# Patient Record
Sex: Male | Born: 1975 | Hispanic: Yes | Marital: Single | Smoking: Never smoker
Health system: Southern US, Community
[De-identification: ages and names within clinical notes are randomized; demographics above are authoritative.]

## PROBLEM LIST (undated history)

## (undated) DIAGNOSIS — M549 Dorsalgia, unspecified: Secondary | ICD-10-CM

## (undated) DIAGNOSIS — J302 Other seasonal allergic rhinitis: Secondary | ICD-10-CM

---

## 2010-05-11 ENCOUNTER — Emergency Department (HOSPITAL_COMMUNITY): Admission: EM | Admit: 2010-05-11 | Discharge: 2010-05-11 | Payer: Self-pay | Admitting: Family Medicine

## 2010-05-20 ENCOUNTER — Emergency Department (HOSPITAL_COMMUNITY): Admission: EM | Admit: 2010-05-20 | Discharge: 2010-05-20 | Payer: Self-pay | Admitting: Family Medicine

## 2010-07-01 ENCOUNTER — Ambulatory Visit (HOSPITAL_COMMUNITY)
Admission: RE | Admit: 2010-07-01 | Discharge: 2010-07-01 | Payer: Self-pay | Source: Home / Self Care | Admitting: Family Medicine

## 2010-09-23 ENCOUNTER — Encounter
Admission: RE | Admit: 2010-09-23 | Discharge: 2010-09-29 | Payer: Self-pay | Source: Home / Self Care | Attending: Family Medicine | Admitting: Family Medicine

## 2010-09-25 ENCOUNTER — Ambulatory Visit (HOSPITAL_COMMUNITY)
Admission: RE | Admit: 2010-09-25 | Discharge: 2010-09-25 | Payer: Self-pay | Source: Home / Self Care | Attending: Family Medicine | Admitting: Family Medicine

## 2010-09-30 ENCOUNTER — Ambulatory Visit: Payer: Self-pay | Attending: Family Medicine | Admitting: Physical Therapy

## 2010-09-30 ENCOUNTER — Encounter: Payer: Self-pay | Admitting: Rehabilitative and Restorative Service Providers"

## 2010-09-30 DIAGNOSIS — IMO0001 Reserved for inherently not codable concepts without codable children: Secondary | ICD-10-CM | POA: Insufficient documentation

## 2010-09-30 DIAGNOSIS — M545 Low back pain, unspecified: Secondary | ICD-10-CM | POA: Insufficient documentation

## 2010-09-30 DIAGNOSIS — M25559 Pain in unspecified hip: Secondary | ICD-10-CM | POA: Insufficient documentation

## 2010-09-30 DIAGNOSIS — M25569 Pain in unspecified knee: Secondary | ICD-10-CM | POA: Insufficient documentation

## 2010-10-07 ENCOUNTER — Ambulatory Visit: Payer: Self-pay | Admitting: Rehabilitative and Restorative Service Providers"

## 2010-10-14 ENCOUNTER — Ambulatory Visit: Payer: Self-pay | Admitting: Rehabilitative and Restorative Service Providers"

## 2010-10-15 ENCOUNTER — Ambulatory Visit: Payer: Self-pay | Admitting: Physical Therapy

## 2010-10-16 ENCOUNTER — Encounter: Payer: Self-pay | Admitting: Physical Therapy

## 2010-10-19 ENCOUNTER — Encounter: Payer: Self-pay | Admitting: Rehabilitative and Restorative Service Providers"

## 2010-10-21 ENCOUNTER — Encounter: Payer: Self-pay | Admitting: Rehabilitative and Restorative Service Providers"

## 2010-11-12 LAB — DIFFERENTIAL
Basophils Absolute: 0 10*3/uL (ref 0.0–0.1)
Basophils Relative: 0 % (ref 0–1)
Eosinophils Absolute: 0.9 10*3/uL — ABNORMAL HIGH (ref 0.0–0.7)
Eosinophils Relative: 10 % — ABNORMAL HIGH (ref 0–5)
Lymphocytes Relative: 32 % (ref 12–46)
Monocytes Absolute: 0.7 10*3/uL (ref 0.1–1.0)

## 2010-11-12 LAB — POCT I-STAT, CHEM 8
BUN: 14 mg/dL (ref 6–23)
Calcium, Ion: 1.17 mmol/L (ref 1.12–1.32)
HCT: 50 % (ref 39.0–52.0)
Hemoglobin: 17 g/dL (ref 13.0–17.0)
Sodium: 141 mEq/L (ref 135–145)
TCO2: 33 mmol/L (ref 0–100)

## 2010-11-12 LAB — CBC
HCT: 45.5 % (ref 39.0–52.0)
MCHC: 34.9 g/dL (ref 30.0–36.0)
MCV: 89.7 fL (ref 78.0–100.0)
Platelets: 174 10*3/uL (ref 150–400)
RDW: 13.1 % (ref 11.5–15.5)
WBC: 8.7 10*3/uL (ref 4.0–10.5)

## 2010-11-12 LAB — GLUCOSE, CAPILLARY: Glucose-Capillary: 113 mg/dL — ABNORMAL HIGH (ref 70–99)

## 2010-12-25 ENCOUNTER — Inpatient Hospital Stay (INDEPENDENT_AMBULATORY_CARE_PROVIDER_SITE_OTHER)
Admission: RE | Admit: 2010-12-25 | Discharge: 2010-12-25 | Disposition: A | Discharge status: Home / Self Care | Payer: Self-pay | Source: Ambulatory Visit | Attending: Family Medicine | Admitting: Family Medicine

## 2010-12-25 DIAGNOSIS — J309 Allergic rhinitis, unspecified: Secondary | ICD-10-CM

## 2011-01-13 ENCOUNTER — Inpatient Hospital Stay (INDEPENDENT_AMBULATORY_CARE_PROVIDER_SITE_OTHER)
Admission: RE | Admit: 2011-01-13 | Discharge: 2011-01-13 | Disposition: A | Discharge status: Home / Self Care | Payer: Self-pay | Source: Ambulatory Visit | Attending: Family Medicine | Admitting: Family Medicine

## 2011-01-13 DIAGNOSIS — J019 Acute sinusitis, unspecified: Secondary | ICD-10-CM

## 2011-03-24 ENCOUNTER — Inpatient Hospital Stay (INDEPENDENT_AMBULATORY_CARE_PROVIDER_SITE_OTHER)
Admission: RE | Admit: 2011-03-24 | Discharge: 2011-03-24 | Disposition: A | Discharge status: Home / Self Care | Payer: Self-pay | Source: Ambulatory Visit | Attending: Emergency Medicine | Admitting: Emergency Medicine

## 2011-03-24 DIAGNOSIS — J309 Allergic rhinitis, unspecified: Secondary | ICD-10-CM

## 2011-03-24 DIAGNOSIS — J019 Acute sinusitis, unspecified: Secondary | ICD-10-CM

## 2011-04-08 ENCOUNTER — Inpatient Hospital Stay (INDEPENDENT_AMBULATORY_CARE_PROVIDER_SITE_OTHER)
Admission: RE | Admit: 2011-04-08 | Discharge: 2011-04-08 | Disposition: A | Discharge status: Home / Self Care | Payer: Self-pay | Source: Ambulatory Visit | Attending: Emergency Medicine | Admitting: Emergency Medicine

## 2011-04-08 DIAGNOSIS — R51 Headache: Secondary | ICD-10-CM

## 2011-10-06 IMAGING — CT CT HEAD W/O CM
1 series · 16 of 30 positions shown, 20 images · non-contrast
Comparison: None.

CLINICAL DATA: Headache.

CT HEAD WITHOUT CONTRAST
TECHNIQUE: Contiguous axial images were obtained from the base of
the skull through the vertex without contrast.

[Series 2: head routine 4.8 h37s · axial · 0.44mm/px · z∈[-136,-3]mm · 16 of 30 slices shown, 20 images]
[im 2/30  brain]
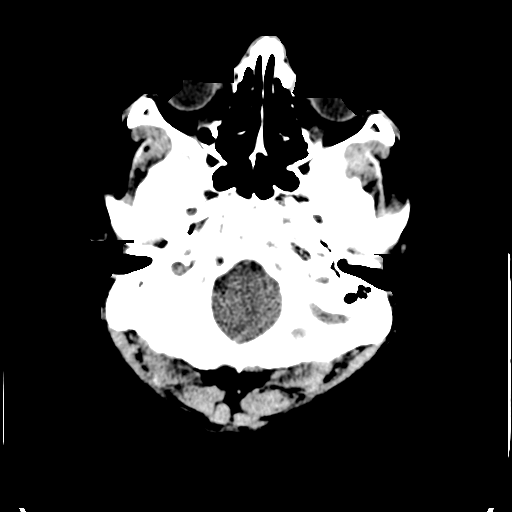
[im 2/30  bone]
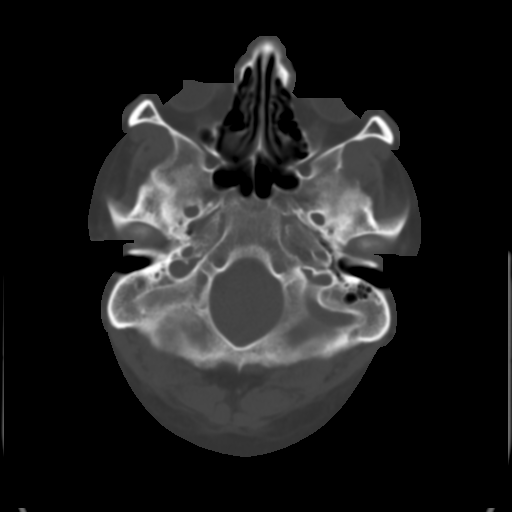
[im 4/30  brain]
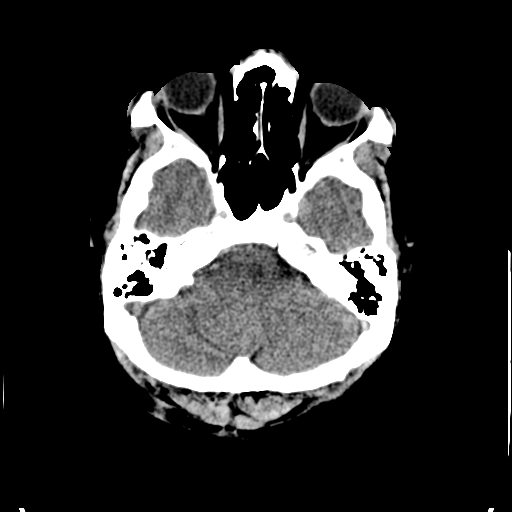
[im 6/30  brain]
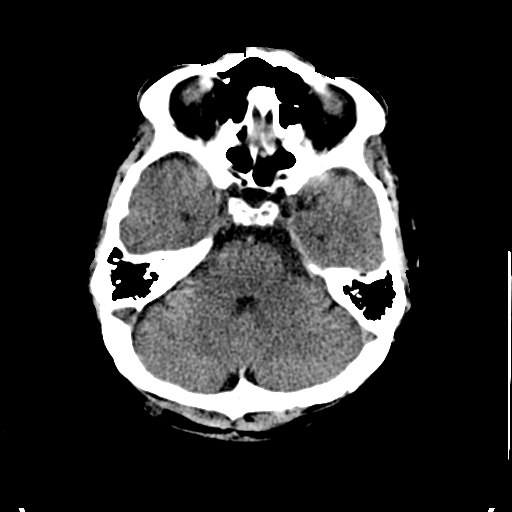
[im 8/30  brain]
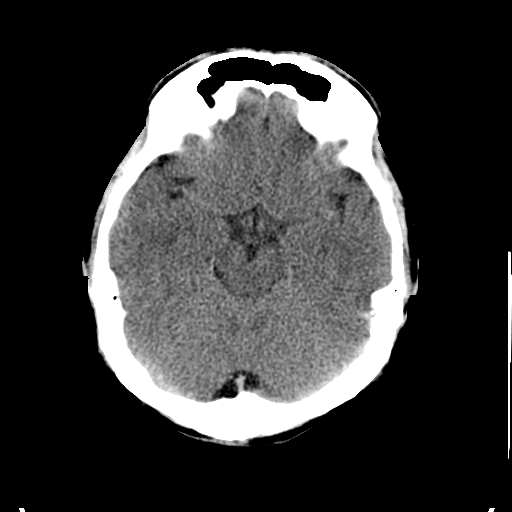
[im 9/30  brain]
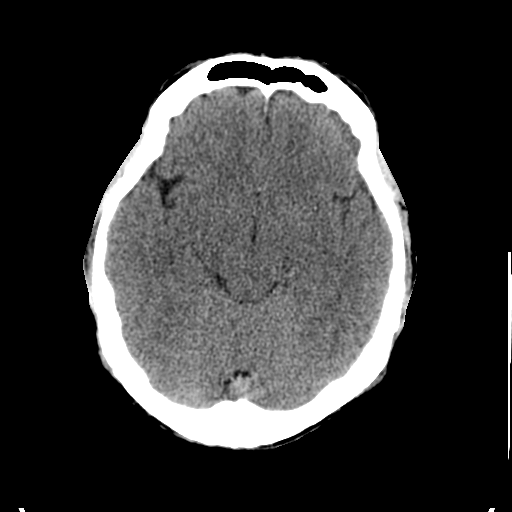
[im 9/30  bone]
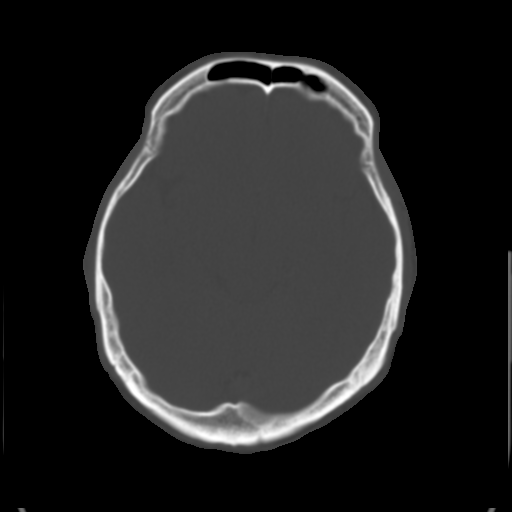
[im 11/30  brain]
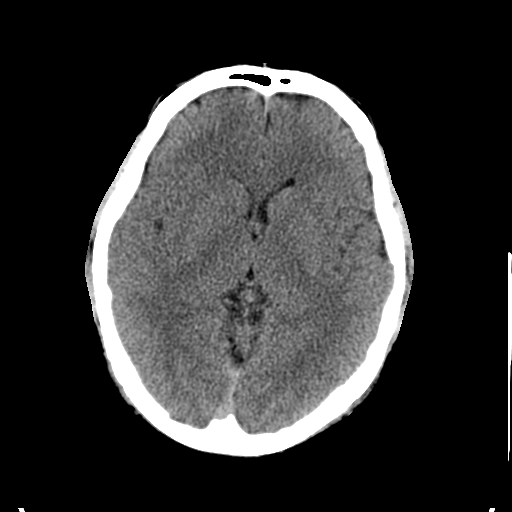
[im 13/30  brain]
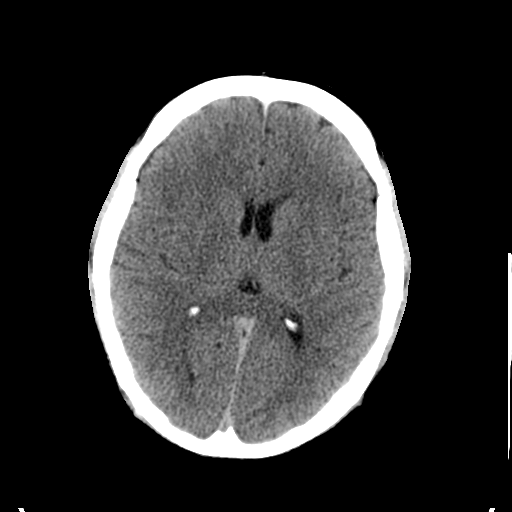
[im 15/30  brain]
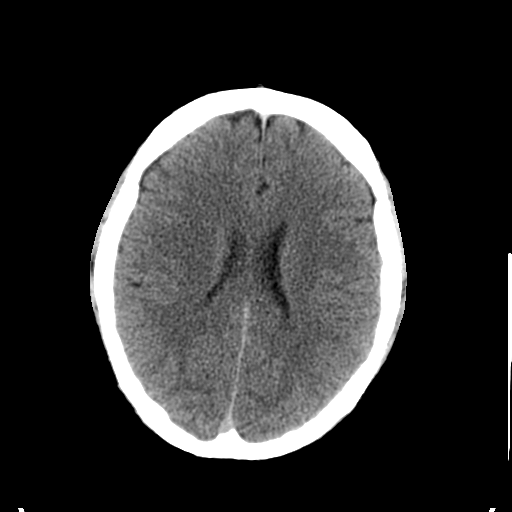
[im 16/30  brain]
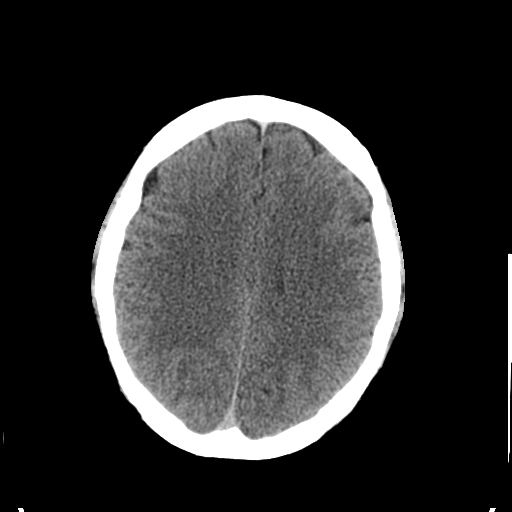
[im 16/30  bone]
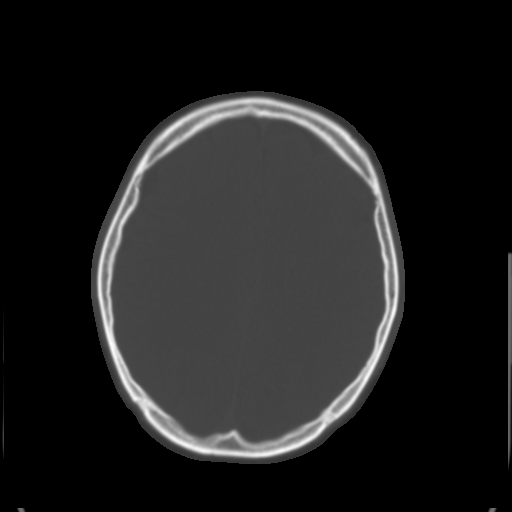
[im 18/30  brain]
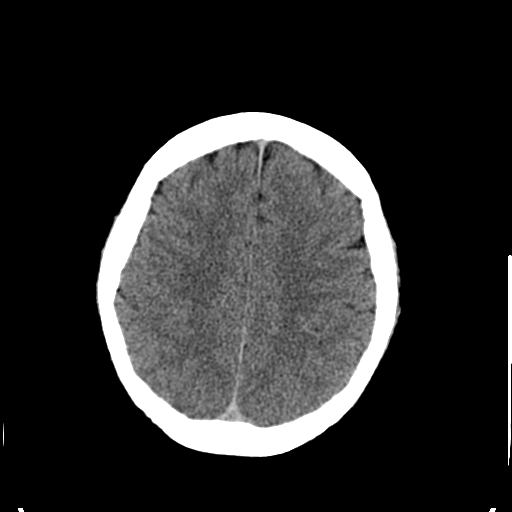
[im 20/30  brain]
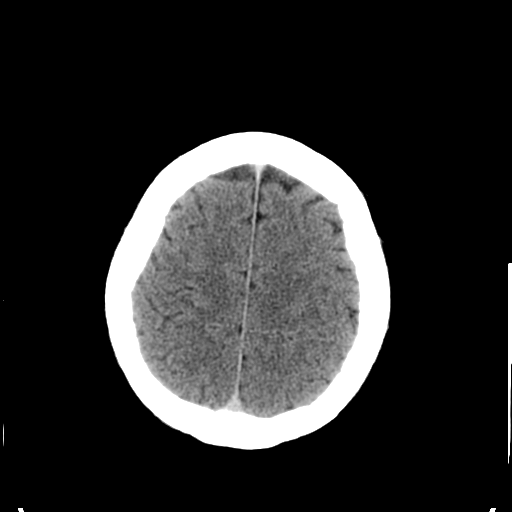
[im 22/30  brain]
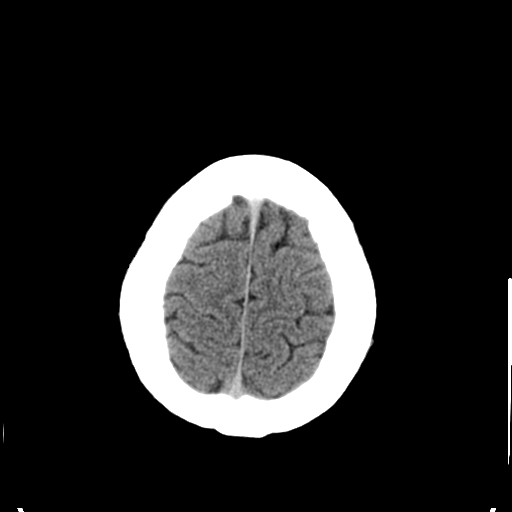
[im 23/30  brain]
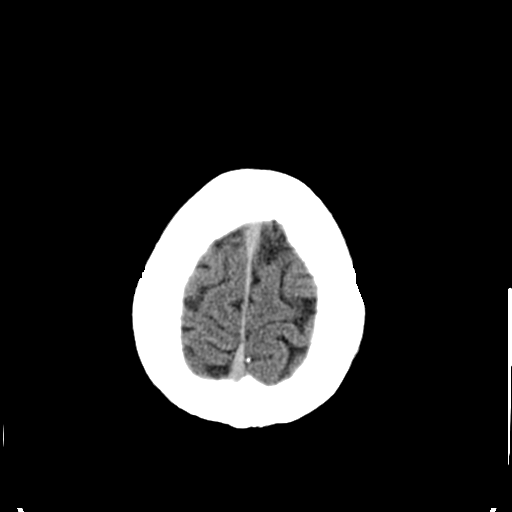
[im 23/30  bone]
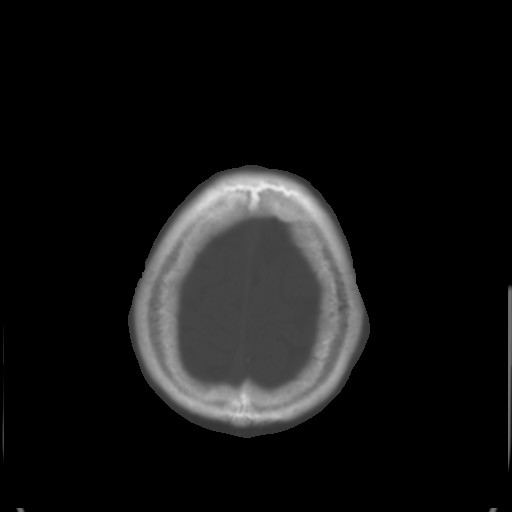
[im 25/30  brain]
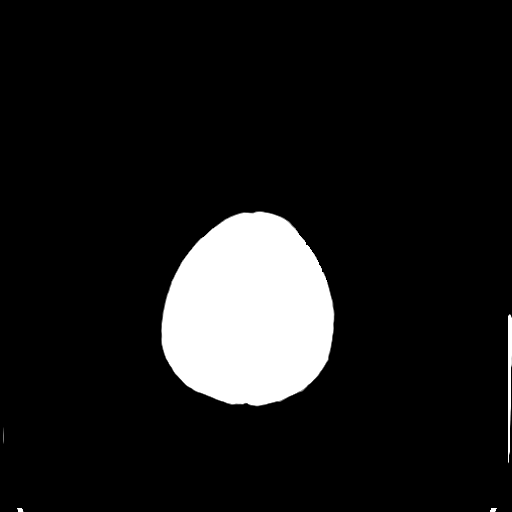
[im 27/30  brain]
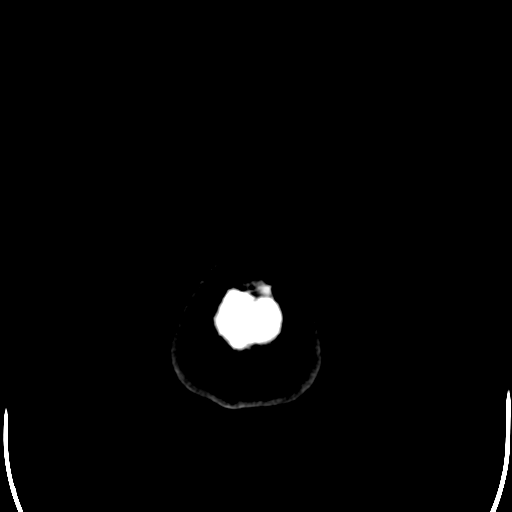
[im 29/30  brain]
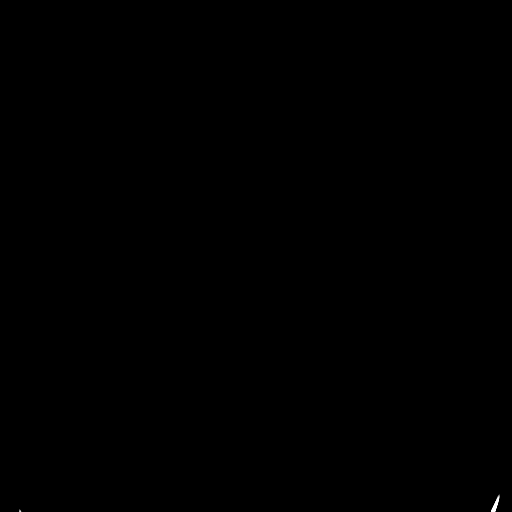

[16 of 30 positions shown; findings below may reference images not displayed]

FINDINGS: There is no acute intracranial infarction, hemorrhage, or
mass lesion.  The brain parenchyma is normal except for slight
asymmetry of the lateral ventricles which is felt to be congenital.

The osseous structures are normal.
IMPRESSION: No significant abnormalities.

## 2011-11-17 IMAGING — CR DG LUMBAR SPINE COMPLETE 4+V
5 series · 5 of 5 positions shown · non-contrast
Comparison: None.

CLINICAL DATA: 33-year-old male with low back pain radiating to the
right leg.  No known injury.

LUMBAR SPINE - COMPLETE 4+ VIEW

[t l-spine a.p.]
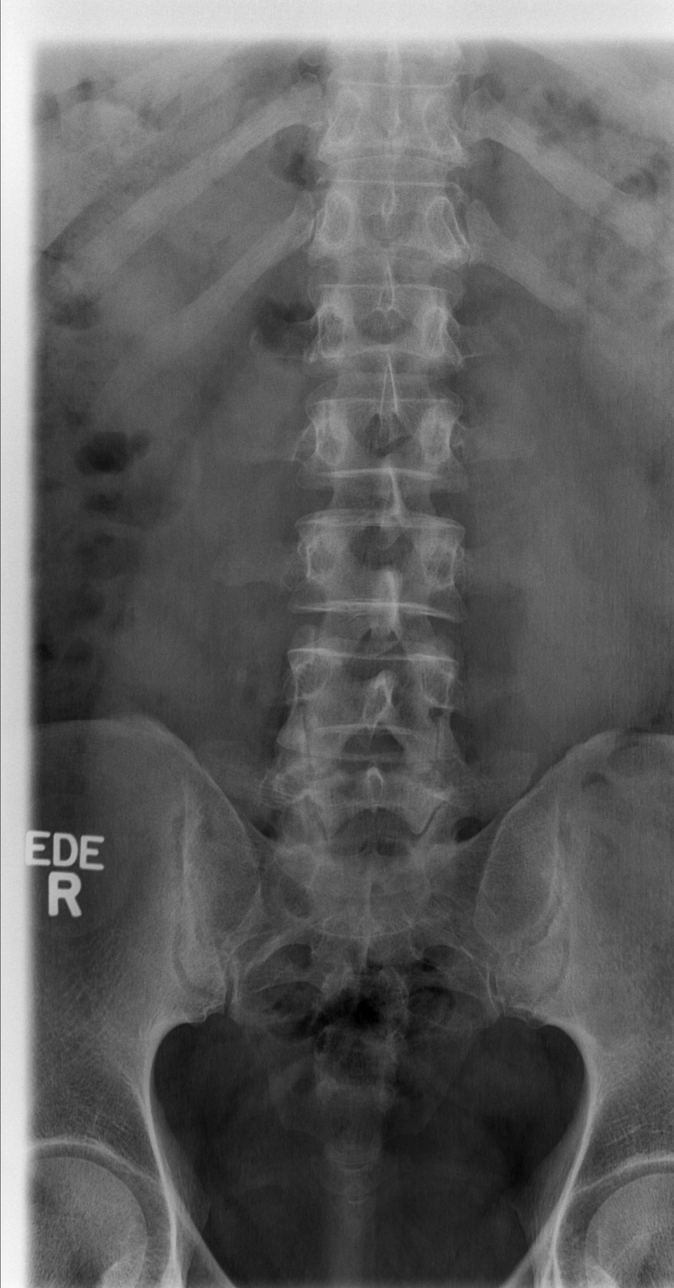

[t l-spine oblique exposure (1 of 2)]
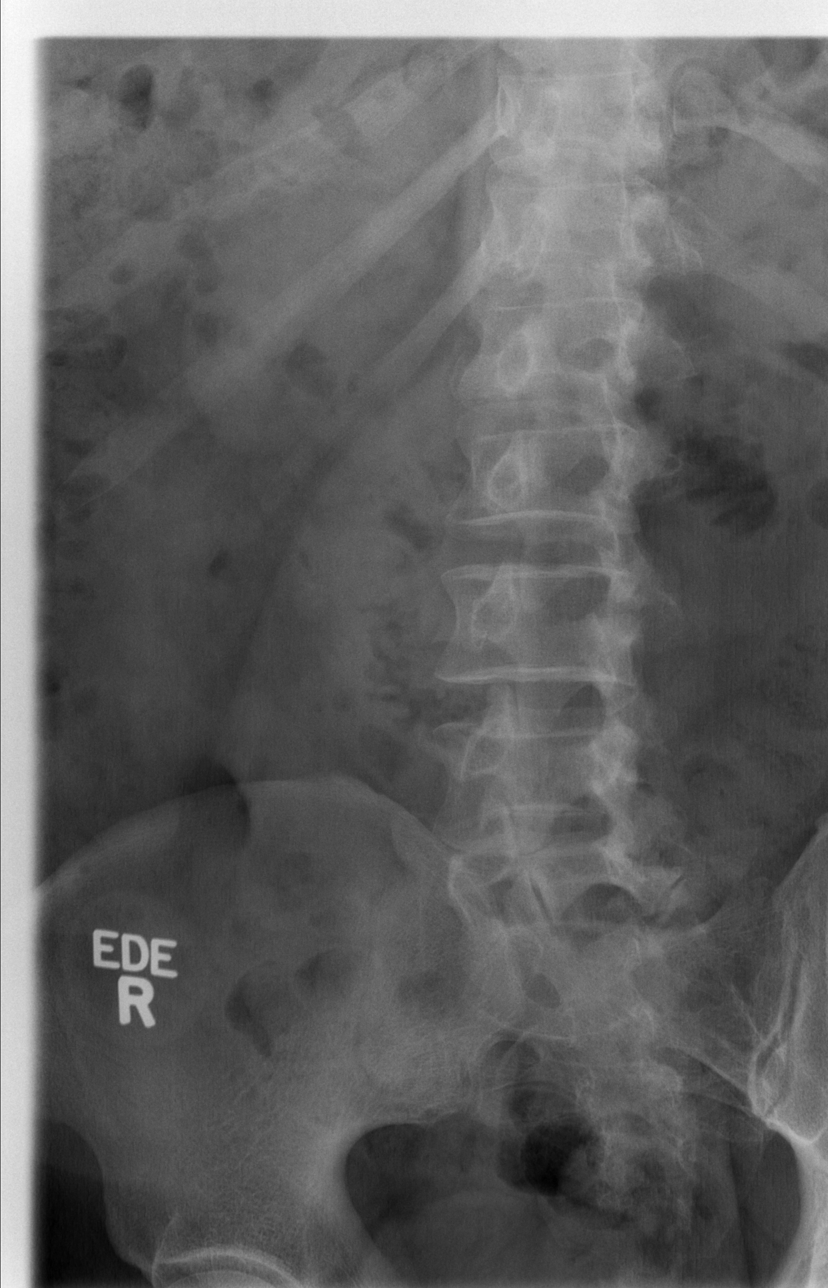

[t l-spine oblique exposure (2 of 2)]
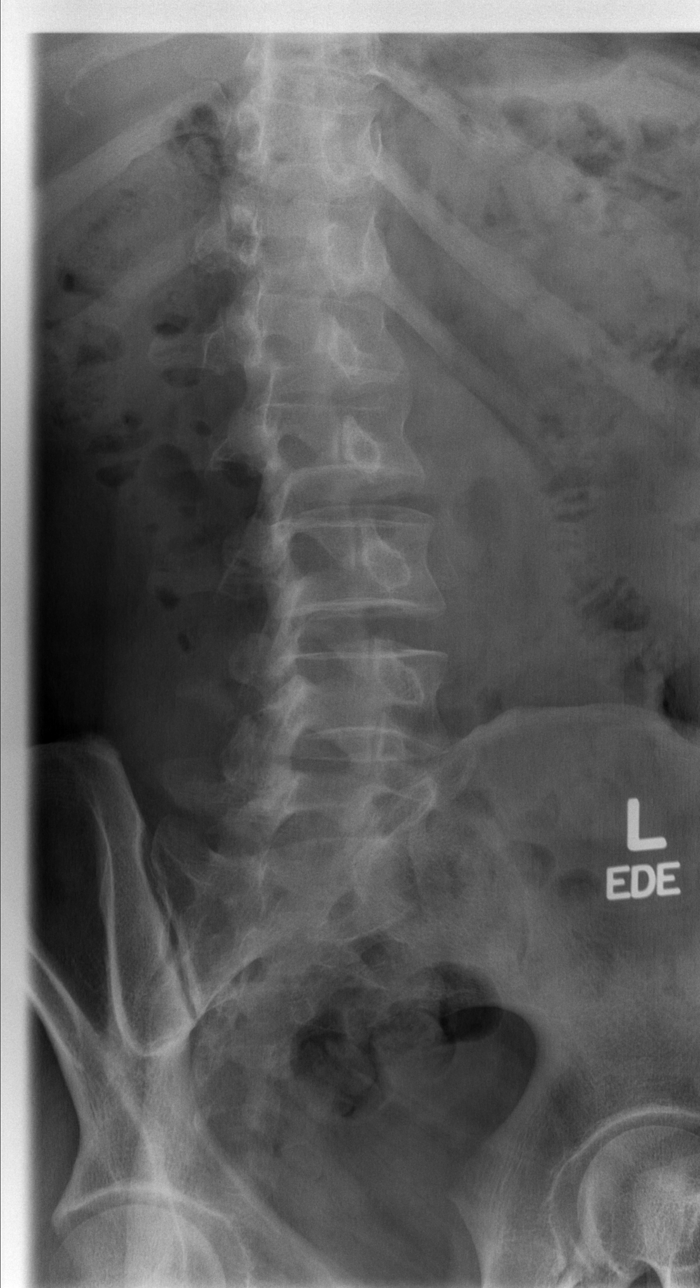

[t l-spine lat]
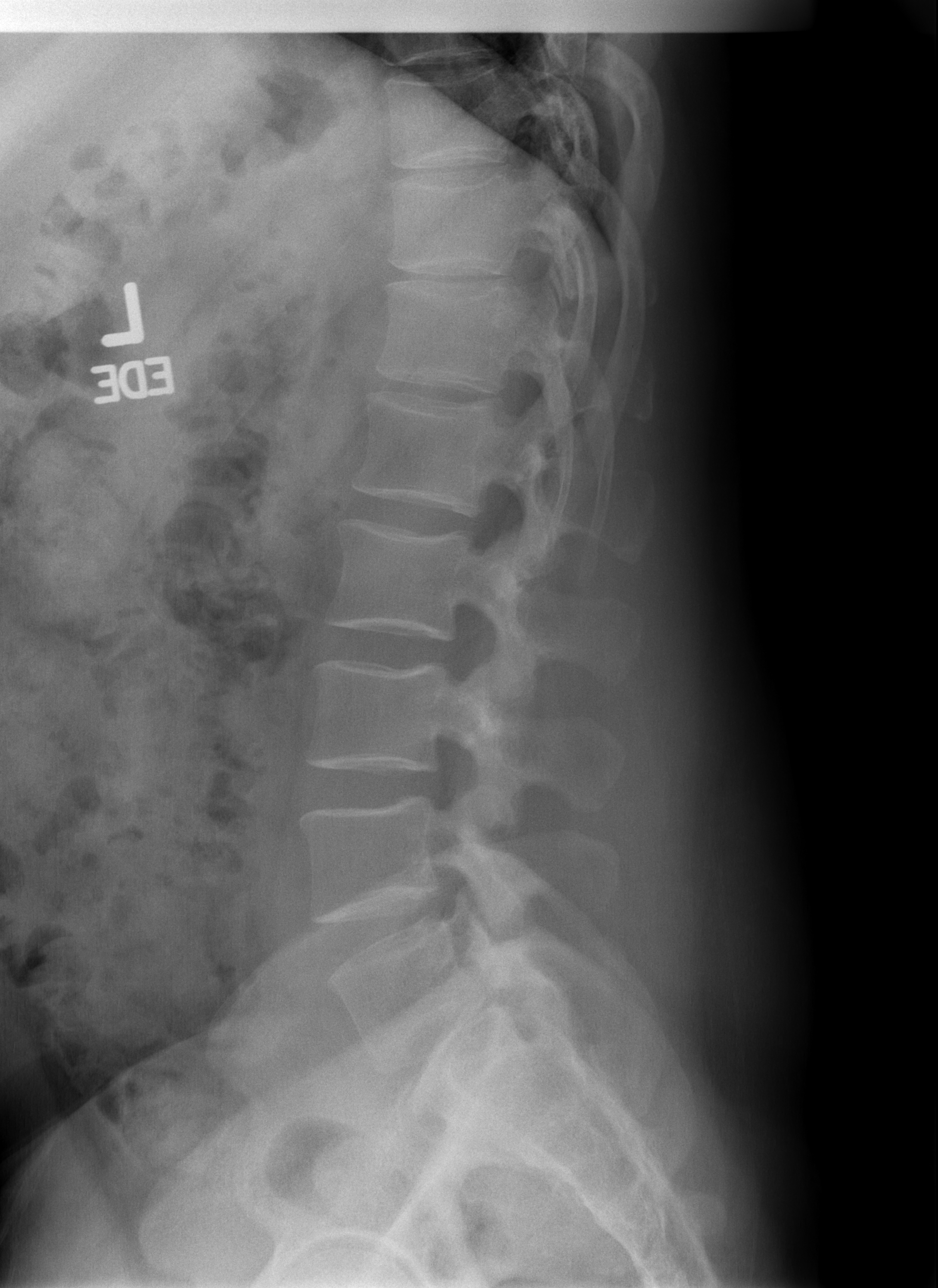

[t l-spine l5-s1 spot]
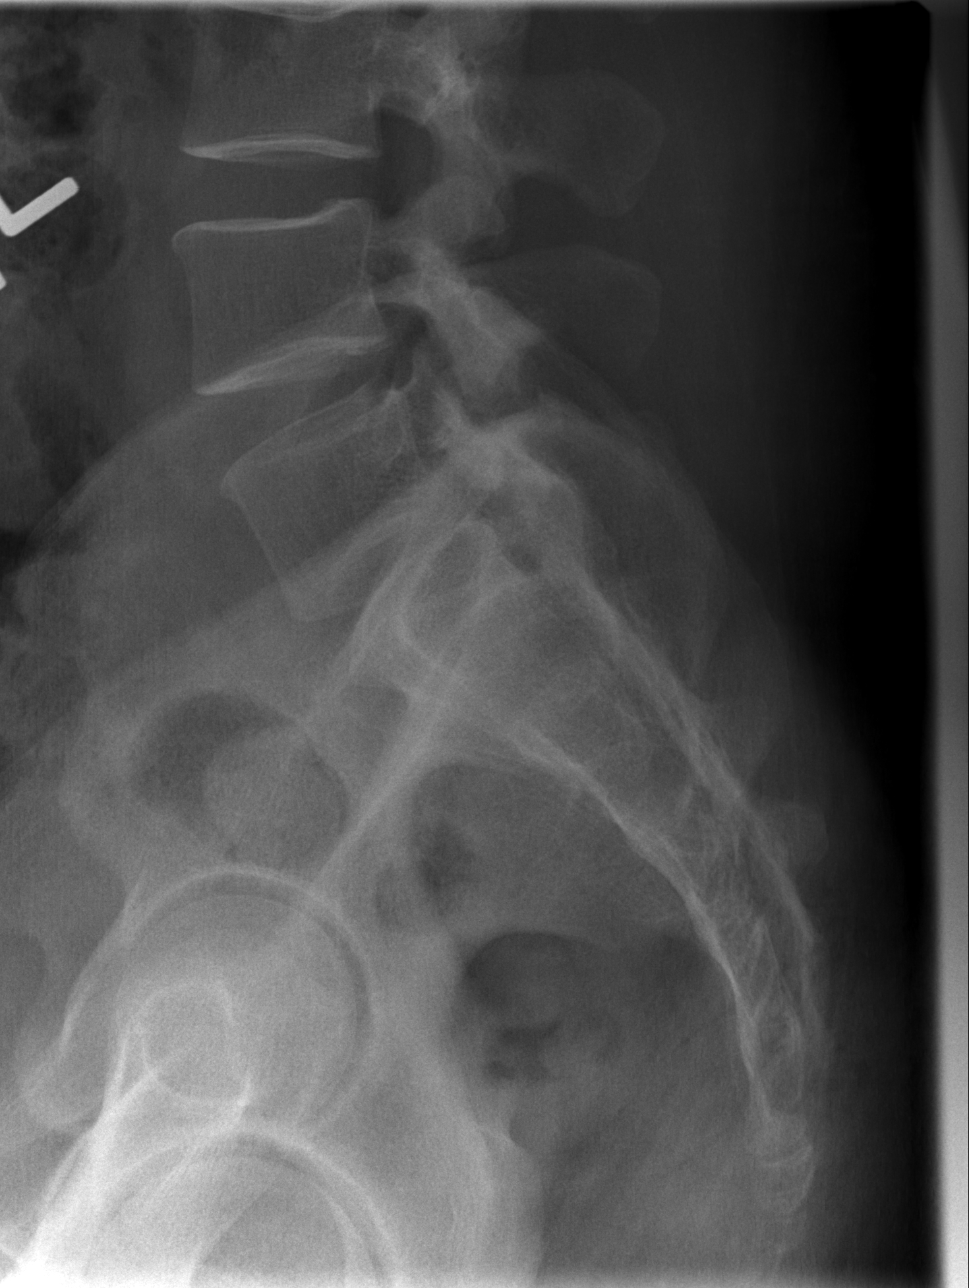

[5 of 5 positions shown; findings below may reference images not displayed]

FINDINGS: Normal lumbar segmentation.  Mild L5-S1 disc space
narrowing which could be congenital.  Otherwise preserved lumbar
disc spaces. Bone mineralization is within normal limits.  Normal
vertebral height and alignment.  No pars fracture.  Very mild
bilateral L5-S1 facet hypertrophy.  Sacrum SI joints are within
normal limits.
IMPRESSION: No acute osseous findings in the lumbar spine.  Very mild L5-S1
facet hypertrophy.  Suggestion of mild L5-S1 disc space narrowing,
but this could be congenital.

## 2011-12-23 ENCOUNTER — Emergency Department (INDEPENDENT_AMBULATORY_CARE_PROVIDER_SITE_OTHER)
Admission: EM | Admit: 2011-12-23 | Discharge: 2011-12-23 | Disposition: A | Discharge status: Home / Self Care | Payer: Self-pay | Source: Home / Self Care | Attending: Family Medicine | Admitting: Family Medicine

## 2011-12-23 ENCOUNTER — Encounter (HOSPITAL_COMMUNITY): Payer: Self-pay | Admitting: *Deleted

## 2011-12-23 DIAGNOSIS — J302 Other seasonal allergic rhinitis: Secondary | ICD-10-CM

## 2011-12-23 DIAGNOSIS — J069 Acute upper respiratory infection, unspecified: Secondary | ICD-10-CM

## 2011-12-23 DIAGNOSIS — J309 Allergic rhinitis, unspecified: Secondary | ICD-10-CM

## 2011-12-23 HISTORY — DX: Dorsalgia, unspecified: M54.9

## 2011-12-23 HISTORY — DX: Other seasonal allergic rhinitis: J30.2

## 2011-12-23 MED ORDER — PREDNISONE (PAK) 10 MG PO TABS: ORAL_TABLET | ORAL | Status: AC

## 2011-12-23 MED ORDER — GUAIFENESIN-CODEINE 100-10 MG/5ML PO SYRP: 5.0000 mL | ORAL_SOLUTION | Freq: Three times a day (TID) | ORAL | Status: AC | PRN

## 2011-12-23 MED ORDER — AZITHROMYCIN 250 MG PO TABS: 250.0000 mg | ORAL_TABLET | Freq: Every day | ORAL | Status: AC

## 2011-12-23 NOTE — ED Provider Notes (Signed)
History     CSN: [id number]  Arrival date & time 12/23/11  7829   First MD Initiated Contact with Patient 12/23/11 [phone]      Chief Complaint  Patient presents with  . Cough  . Nasal Congestion  . Back Pain    (Consider location/radiation/quality/duration/timing/severity/associated sxs/prior treatment) HPI Comments: THE PATIENT REPORTS HAVING A COUGH AND RUNNY NOSE X 2 WKS. NO FEVER. COUGH IS WORSE IN THE AM. TAKING OTC ROBITUSSIN AND NAPROXEN. NO WHEEZING OR SHORTNESS OF BREATH. HE HAS ALSO HAD SOME ISSUES WITH CHORNIC BACK PAIN. IS TAKING FLANAX WITH SOME RELIEF. NO NEW SYMPTOMS OR INJURY. HAS HAD CORTISONE INJECTIONS IN THE PAST.   Patient is a 36 y.o. male presenting with back pain. The history is provided by the patient. The history is limited by a language barrier. No language interpreter was used.  Back Pain     Past Medical History  Diagnosis Date  . Back pain   . Seasonal allergies     History reviewed. No pertinent past surgical history.  History reviewed. No pertinent family history.  History  Substance Use Topics  . Smoking status: Never Smoker   . Smokeless tobacco: Not on file  . Alcohol Use: No      Review of Systems  Constitutional: Negative.   HENT: Positive for congestion, rhinorrhea and postnasal drip. Negative for sore throat.   Respiratory: Positive for cough. Negative for wheezing.   Cardiovascular: Negative.   Gastrointestinal: Negative.   Musculoskeletal: Positive for back pain.  Skin: Negative.     Allergies  Penicillins  Home Medications   Current Outpatient Rx  Name Route Sig Dispense Refill  . AZITHROMYCIN 250 MG PO TABS Oral Take 1 tablet (250 mg total) by mouth daily. Take first 2 tablets together, then 1 every day until finished. 6 tablet 0  . GUAIFENESIN-CODEINE 100-10 MG/5ML PO SYRP Oral Take 5 mLs by mouth 3 (three) times daily as needed for cough. 120 mL 0  . PREDNISONE (PAK) 10 MG PO TABS  DISP ONE 6 DAYS TAPER TAKE AS  DIRECTED WITH FOOD 21 tablet 0    BP 123/82  Pulse 60  Temp(Src) 96.8 F (36 C) (Oral)  Resp 18  SpO2 96%  Physical Exam  Nursing note and vitals reviewed. Constitutional: He appears well-developed and well-nourished. No distress.  HENT:  Head: Normocephalic and atraumatic.       EARS CLEAR, THROAT MILD ERYTHEMA WITH POST NASAL DRAINAGE. NOSE CONGESTED  Neck: Normal range of motion. Neck supple.  Cardiovascular: Normal rate, regular rhythm and normal heart sounds.   Pulmonary/Chest: Effort normal.       CONGESTED COUGH .SOME WHEEZING NOTED.   Lymphadenopathy:    He has no cervical adenopathy.  Skin: Skin is warm and dry.    ED Course  Procedures (including critical care time)  Labs Reviewed - No data to display No results found.   1. URI (upper respiratory infection)   2. Seasonal allergies       MDM          Randa Spike, MD 12/23/11 [phone]

## 2011-12-23 NOTE — Discharge Instructions (Signed)
TYLENOL OR MOTRIN AS NEEDED. AVOID CAFFEINE AND MILK PRODUCTS. RECOMMEND ZYRTEC. CAN CONTINUE FLANAX.

## 2011-12-23 NOTE — ED Notes (Signed)
Pt is here with complaints of runny nose and cough x 2 weeks.  Reports cough is more productive in the AM with green mucous.  Denies fever, does report crusty eyes in the am.  Back pain is due to chronic disc issue, pt has had cortisone injections in the past.

## 2013-11-16 ENCOUNTER — Encounter (HOSPITAL_COMMUNITY): Payer: Self-pay | Admitting: Emergency Medicine

## 2013-11-16 ENCOUNTER — Emergency Department (HOSPITAL_COMMUNITY)
Admission: EM | Admit: 2013-11-16 | Discharge: 2013-11-16 | Disposition: A | Discharge status: Home / Self Care | Payer: No Typology Code available for payment source | Source: Home / Self Care | Attending: Family Medicine | Admitting: Family Medicine

## 2013-11-16 DIAGNOSIS — J309 Allergic rhinitis, unspecified: Secondary | ICD-10-CM

## 2013-11-16 DIAGNOSIS — J302 Other seasonal allergic rhinitis: Secondary | ICD-10-CM

## 2013-11-16 MED ORDER — METHYLPREDNISOLONE ACETATE 80 MG/ML IJ SUSP
80.0000 mg | Freq: Once | INTRAMUSCULAR | Status: AC
  Administered 2013-11-16: 80 mg via INTRAMUSCULAR

## 2013-11-16 MED ORDER — METHYLPREDNISOLONE ACETATE 80 MG/ML IJ SUSP
INTRAMUSCULAR | Status: AC
  Filled 2013-11-16: qty 1

## 2013-11-16 MED ORDER — CETIRIZINE HCL 10 MG PO TABS: 10.0000 mg | ORAL_TABLET | Freq: Every day | ORAL | Status: DC

## 2013-11-16 MED ORDER — FLUTICASONE PROPIONATE 50 MCG/ACT NA SUSP
1.0000 | Freq: Two times a day (BID) | NASAL | Status: DC
Start: 2013-11-16 — End: 2014-06-07

## 2013-11-16 NOTE — ED Notes (Signed)
Pt  Reports   Symptoms        Of sinus  Drainage  /  Congestion     And   Pain  Behind  Eyes         Foe  sev  Days   Also reports  Some  Upper back pain as  Well             Ptis  Alert and  Oriented   Sitting upright on  Exam table            In no  Acute  Distress

## 2013-11-16 NOTE — ED Provider Notes (Signed)
CSN: [id number]     Arrival date & time 11/16/13  1115 History   First MD Initiated Contact with Patient 11/16/13 1223     Chief Complaint  Patient presents with  . URI   (Consider location/radiation/quality/duration/timing/severity/associated sxs/prior Treatment) Patient is a 38 y.o. male presenting with URI. The history is provided by the patient.  URI Presenting symptoms: congestion and rhinorrhea   Presenting symptoms: no fever   Severity:  Moderate Onset quality:  Gradual Duration:  1 week Progression:  Unchanged Chronicity:  New Associated symptoms: sneezing     Past Medical History  Diagnosis Date  . Back pain   . Seasonal allergies    History reviewed. No pertinent past surgical history. History reviewed. No pertinent family history. History  Substance Use Topics  . Smoking status: Never Smoker   . Smokeless tobacco: Not on file  . Alcohol Use: No    Review of Systems  Constitutional: Negative for fever.  HENT: Positive for congestion, rhinorrhea and sneezing.     Allergies  Penicillins  Home Medications   Current Outpatient Rx  Name  Route  Sig  Dispense  Refill  . cetirizine (ZYRTEC) 10 MG tablet   Oral   Take 1 tablet (10 mg total) by mouth daily. One tab daily for allergies   30 tablet   1   . fluticasone (FLONASE) 50 MCG/ACT nasal spray   Each Nare   Place 1 spray into both nostrils 2 (two) times daily.   1 g   2   . predniSONE (STERAPRED UNI-PAK) 10 MG tablet      DISP ONE 6 DAYS TAPER TAKE AS DIRECTED WITH FOOD   21 tablet   0    BP 118/76  Pulse 60  Temp(Src) 98.9 F (37.2 C) (Oral)  Resp 16  SpO2 100% Physical Exam  Nursing note and vitals reviewed. Constitutional: He is oriented to person, place, and time. He appears well-developed and well-nourished.  HENT:  Right Ear: External ear normal.  Left Ear: External ear normal.  Mouth/Throat: Oropharynx is clear and moist.  Eyes: Conjunctivae are normal. Pupils are equal,  round, and reactive to light.  Neck: Normal range of motion. Neck supple.  Cardiovascular: Normal heart sounds.   Pulmonary/Chest: Effort normal and breath sounds normal.  Lymphadenopathy:    He has no cervical adenopathy.  Neurological: He is alert and oriented to person, place, and time.  Skin: Skin is warm and dry.    ED Course  Procedures (including critical care time) Labs Review Labs Reviewed - No data to display Imaging Review No results found.   MDM   1. Seasonal allergic rhinitis        Linna HoffJames D Geniene List, MD 11/18/13 1026

## 2014-06-07 ENCOUNTER — Encounter (HOSPITAL_COMMUNITY): Payer: Self-pay | Admitting: Emergency Medicine

## 2014-06-07 ENCOUNTER — Emergency Department (HOSPITAL_COMMUNITY)
Admission: EM | Admit: 2014-06-07 | Discharge: 2014-06-07 | Disposition: A | Discharge status: Home / Self Care | Payer: No Typology Code available for payment source | Source: Home / Self Care | Attending: Family Medicine | Admitting: Family Medicine

## 2014-06-07 DIAGNOSIS — J302 Other seasonal allergic rhinitis: Secondary | ICD-10-CM

## 2014-06-07 DIAGNOSIS — M5431 Sciatica, right side: Secondary | ICD-10-CM

## 2014-06-07 DIAGNOSIS — S39012A Strain of muscle, fascia and tendon of lower back, initial encounter: Secondary | ICD-10-CM

## 2014-06-07 MED ORDER — METHOCARBAMOL 500 MG PO TABS: 500.0000 mg | ORAL_TABLET | Freq: Four times a day (QID) | ORAL | Status: AC | PRN

## 2014-06-07 MED ORDER — DICLOFENAC SODIUM 75 MG PO TBEC: 75.0000 mg | DELAYED_RELEASE_TABLET | Freq: Two times a day (BID) | ORAL | Status: DC

## 2014-06-07 MED ORDER — FLUTICASONE PROPIONATE 50 MCG/ACT NA SUSP
1.0000 | Freq: Two times a day (BID) | NASAL | Status: DC
Start: 2014-06-07 — End: 2015-02-26

## 2014-06-07 NOTE — Discharge Instructions (Signed)
Please start the voltaren for back pain and the exercises below Please start the flonase at night for at least 2 weeks for allergies Please get in to see a regular doctor - [place] Good luck with the dental work   Engineer, agriculturalCitica  (Sciatica)  La citica es Chief Technology Officerel dolor, debilidad, entumecimiento u hormigueo a lo largo del nervio citico. El nervio comienza en la zona inferior de la espalda y desciende por la parte posterior de cada pierna. El nervio controla los msculos de la parte inferior de la pierna y de la zona posterior de la rodilla, y transmite la sensibilidad a la parte posterior del muslo, la pierna y la planta del pie. La citica es un sntoma de otras afecciones mdicas. Por ejemplo, un dao a los nervios o algunas enfermedades como un disco herniado o un espoln seo en la columna vertebral, podran daarle o presionar en el nervio citico. Esto causa dolor, debilidad y otras sensaciones normalmente asociadas con la citica. Generalmente la citica afecta slo un lado del cuerpo. CAUSAS   Disco herniado o desplazado.  Enfermedad degenerativa del disco.  Un sndrome doloroso que compromete un msculo angosto de los glteos (sndrome piriforme).  Lesin o fractura plvica.  Embarazo.  Tumor (casos raros). SNTOMAS  Los sntomas pueden variar de leves a muy graves. Por lo general, los sntomas descienden desde la zona lumbar a las nalgas y la parte posterior de la pierna. Ellos son:   Hormigueo leve o dolor sordo en la parte inferior de la espalda, la pierna o la cadera.  Adormecimiento en la parte posterior de la pantorrilla o la planta del pie.  Sensacin de KeySpanquemazn en la zona lumbar, la pierna o la cadera.  Dolor agudo en la zona inferior de la espalda, la pierna o la cadera.  Debilidad en las piernas.  Dolor de espalda intenso que Raytheoninhibe los movimientos. Los sntomas pueden empeorar al toser, Engineering geologistestornudar, rer o estar sentado o parado durante Con-waymucho tiempo.  Adems, el sobrepeso puede empeorar los sntomas.  DIAGNSTICO  Su mdico le har un examen fsico para buscar los sntomas comunes de la citica. Le pedir que haga algunos movimientos o actividades que activaran el dolor del nervio citico. Para encontrar las causas de la citica podr indicarle otros estudios. Estos pueden ser:   Anlisis de Goltrysangre.  Radiografas.  Pruebas de diagnstico por imgenes, como resonancia magntica o tomografa computada. TRATAMIENTO  El tratamiento se dirige a las causas de la citica. A veces, el tratamiento no es necesario, y Chief Technology Officerel dolor y Environmental health practitionerel malestar desaparecen por s mismos. Si necesita tratamiento, su mdico puede sugerir:   Medicamentos de venta libre para Engineer, materialsaliviar el dolor.  Medicamentos recetados, como antiinflamatorios, relajantes musculares o narcticos.  Aplicacin de calor o hielo en la zona del dolor.  Inyecciones de corticoides para disminuir el dolor, la irritacin y la inflamacin alrededor del nervio.  Reduccin de la Marriottactividad en los perodos de Amherstdolor.  Ejercicios y estiramiento del abdomen para fortalecer y Scientist, clinical (histocompatibility and immunogenetics)mejorar la flexibilidad de la columna vertebral. Su mdico puede sugerirle perder peso si el peso extra empeora el dolor de espalda.  Fisioterapia.  La ciruga para eliminar lo que presiona o pincha el nervio, como un espoln seo o parte de una hernia de disco. INSTRUCCIONES PARA EL CUIDADO EN EL HOGAR   Slo tome medicamentos de venta libre o recetados para Primary school teachercalmar el dolor o Environmental health practitionerel malestar, segn las indicaciones de su mdico.  Aplique hielo sobre el rea dolorida  durante 20 minutos 3-4 veces por da durante los primeras 48-72 horas. Luego intente aplicar calor de la misma manera.  Haga ejercicios, elongue o realice sus actividades habituales, si no le causan ms dolor.  Cumpla con todas las sesiones de fisioterapia, segn le indique su mdico.  Cumpla con todas las visitas de control, segn le indique su mdico.  No use  tacones altos o zapatos que no tengan buen apoyo.  Verifique que el colchn no sea muy blando. Un colchn firme Engineer, materialsaliviar el dolor y las Donovan Estatesmolestias. SOLICITE ATENCIN MDICA DE INMEDIATO SI:   Pierde el control de la vejiga o del intestino (incontinencia).  Aumenta la debilidad en la zona inferior de la espalda, la pelvis, las nalgas o las piernas.  Siente irritacin o inflamacin en la espalda.  Tiene sensacin de ardor al ConocoPhillipsorinar.  El dolor empeora cuando se acuesta o lo despierta por la noche.  El dolor es peor del que experiment en el pasado.  Dura ms de 4 semanas.  Pierde peso sin motivo de Mettawamanera sbita. ASEGRESE DE QUE:   Comprende estas instrucciones.  Controlar su enfermedad.  Solicitar ayuda de inmediato si no mejora o si empeora. Document Released: 08/16/2005 Document Revised: 02/15/2012 [facility] Patient Information 2015 [employer], [place]. This information is not intended to replace advice given to you by your health care provider. Make sure you discuss any questions you have with your health care provider.

## 2014-06-07 NOTE — ED Notes (Signed)
Pt complain of chronic back pain that will radiate to his right foot.  Needs more pain medication.  Pt also states he is having some trouble with his allergies.  He takes OTC medication at home that works, but is afraid to take it every day.

## 2014-06-07 NOTE — ED Provider Notes (Signed)
CSN: [id number]     Arrival date & time 06/07/14  1042 History   First MD Initiated Contact with Patient 06/07/14 1136     Chief Complaint  Patient presents with  . Allergies  . Back Pain    radiates down to his right foot   (Consider location/radiation/quality/duration/timing/severity/associated sxs/prior Treatment) HPI  Back pain: started 3-4 years ago. Comes and goes. Ibuprofen 600mg  w/o relief. Lower back. Radiation down R leg. H/o fall while horseback riding several years ago and a fall on ice all of which hurt back. No change over the years. Pain gets worse w/ significant ambulation/running or cold weather. Pain is only mild today   Previous lumbar imaging from 2011  IMPRESSION:  No acute osseous findings in the lumbar spine. Very mild L5-S1  facet hypertrophy. Suggestion of mild L5-S1 disc space narrowing,  but this could be congenital.    Allergies: improve w/ zyrtec. Runny nose and scratchy throat. Worse in spring and fall.      Past Medical History  Diagnosis Date  . Back pain   . Seasonal allergies    History reviewed. No pertinent past surgical history. History reviewed. No pertinent family history. History  Substance Use Topics  . Smoking status: Never Smoker   . Smokeless tobacco: Not on file  . Alcohol Use: No    Review of Systems Per HPI with all other pertinent systems negative.   Allergies  Penicillins  Home Medications   Prior to Admission medications   Medication Sig Start Date End Date Taking? Authorizing Provider  cetirizine (ZYRTEC) 10 MG tablet Take 1 tablet (10 mg total) by mouth daily. One tab daily for allergies 11/16/13  Yes Linna HoffJames D Kindl, MD  diclofenac (VOLTAREN) 75 MG EC tablet Take 1 tablet (75 mg total) by mouth 2 (two) times daily. 06/07/14   Ozella Rocksavid J Keely Drennan, MD  fluticasone (FLONASE) 50 MCG/ACT nasal spray Place 1 spray into both nostrils 2 (two) times daily. 06/07/14   Ozella Rocksavid J Whitney Hillegass, MD  predniSONE (STERAPRED UNI-PAK) 10 MG  tablet DISP ONE 6 DAYS TAPER TAKE AS DIRECTED WITH FOOD 12/23/11   Claretha CooperKimberly G Lykins, DO   BP 113/78  Pulse 52  Temp(Src) 97.4 F (36.3 C) (Oral)  SpO2 97% Physical Exam  Constitutional: He is oriented to person, place, and time. He appears well-developed and well-nourished.  HENT:  Head: Normocephalic and atraumatic.  Numerous dental carries and caps/crowns   Eyes: EOM are normal. Pupils are equal, round, and reactive to light.  Neck: Normal range of motion.  Pulmonary/Chest: Effort normal. No respiratory distress.  Abdominal: He exhibits no distension.  Musculoskeletal: Normal range of motion.  L lumbar perispinal mild ttp. Strength 5/5 in back and LE. FROM.   Neurological: He is alert and oriented to person, place, and time. No cranial nerve deficit. He exhibits normal muscle tone. Coordination normal.  Skin: Skin is warm. He is not diaphoretic.  Psychiatric: He has a normal mood and affect. His behavior is normal. Judgment and thought content normal.    ED Course  Procedures (including critical care time) Labs Review Labs Reviewed - No data to display  Imaging Review No results found.   MDM   1. Back strain, initial encounter   2. Other seasonal allergic rhinitis   3. Sciatica neuralgia, right    Robaxin, voltaren Not using flonase . represcribed Start exercises Consider PT as previously referred there Dental paperwork filled out for pt to go to [facility] - pt  w/ orange card  Precautions given and all questions answered  Shelly Flattenavid Oluwadarasimi Redmon, MD Family Medicine 06/07/2014, 12:00 PM      Ozella Rocksavid J Suzzanne Brunkhorst, MD 06/07/14 1200

## 2015-01-02 ENCOUNTER — Ambulatory Visit: Payer: Self-pay | Admitting: Family Medicine

## 2015-02-26 ENCOUNTER — Encounter: Payer: Self-pay | Admitting: Family Medicine

## 2015-02-26 ENCOUNTER — Ambulatory Visit (INDEPENDENT_AMBULATORY_CARE_PROVIDER_SITE_OTHER): Payer: No Typology Code available for payment source | Admitting: Family Medicine

## 2015-02-26 VITALS — BP 108/65 | HR 53 | Temp 97.8°F | Resp 16 | Ht 64.0 in | Wt 195.0 lb

## 2015-02-26 DIAGNOSIS — M5442 Lumbago with sciatica, left side: Secondary | ICD-10-CM

## 2015-02-26 DIAGNOSIS — M545 Low back pain, unspecified: Secondary | ICD-10-CM | POA: Insufficient documentation

## 2015-02-26 DIAGNOSIS — Z0000 Encounter for general adult medical examination without abnormal findings: Secondary | ICD-10-CM

## 2015-02-26 LAB — CBC WITH DIFFERENTIAL/PLATELET
BASOS ABS: 0 10*3/uL (ref 0.0–0.1)
BASOS PCT: 0 % (ref 0–1)
EOS PCT: 19 % — AB (ref 0–5)
Eosinophils Absolute: 1.3 10*3/uL — ABNORMAL HIGH (ref 0.0–0.7)
HCT: 43.7 % (ref 39.0–52.0)
Hemoglobin: 15.2 g/dL (ref 13.0–17.0)
Lymphocytes Relative: 32 % (ref 12–46)
Lymphs Abs: 2.2 10*3/uL (ref 0.7–4.0)
MCH: 30.8 pg (ref 26.0–34.0)
MCHC: 34.8 g/dL (ref 30.0–36.0)
MCV: 88.6 fL (ref 78.0–100.0)
MONO ABS: 0.5 10*3/uL (ref 0.1–1.0)
MPV: 9.6 fL (ref 8.6–12.4)
Monocytes Relative: 7 % (ref 3–12)
NEUTROS ABS: 2.9 10*3/uL (ref 1.7–7.7)
Neutrophils Relative %: 42 % — ABNORMAL LOW (ref 43–77)
PLATELETS: 186 10*3/uL (ref 150–400)
RBC: 4.93 MIL/uL (ref 4.22–5.81)
RDW: 13.7 % (ref 11.5–15.5)
WBC: 7 10*3/uL (ref 4.0–10.5)

## 2015-02-26 MED ORDER — CETIRIZINE HCL 10 MG PO TABS: 10.0000 mg | ORAL_TABLET | Freq: Every day | ORAL | Status: AC

## 2015-02-26 MED ORDER — FLUTICASONE PROPIONATE 50 MCG/ACT NA SUSP: 1.0000 | Freq: Two times a day (BID) | NASAL | Status: AC

## 2015-02-26 MED ORDER — DICLOFENAC SODIUM 75 MG PO TBEC: 75.0000 mg | DELAYED_RELEASE_TABLET | Freq: Two times a day (BID) | ORAL | Status: AC

## 2015-02-26 NOTE — Progress Notes (Signed)
Patient ID: Clinton Alvarado, male   DOB: Dec 07, 1975, 39 y.o.   MRN: [id number]   Clinton Alvarado, is a 39 y.o. male  [id number]:[id number]  [id number]  DOB - 1976-02-14  CC:  Chief Complaint  Patient presents with  . Establish Care  . Back Pain       HPI: Clinton Alvarado is a 39 y.o. male here today to establish medical care. Patient has No headache, No chest pain, No abdominal pain - No Nausea, No new weakness tingling or numbness, No Cough - SOB.  Allergies  Allergen Reactions  . Penicillins    Past Medical History  Diagnosis Date  . Back pain   . Seasonal allergies    Current Outpatient Prescriptions on File Prior to Visit  Medication Sig Dispense Refill  . methocarbamol (ROBAXIN) 500 MG tablet Take 1-2 tablets (500-1,000 mg total) by mouth every 6 (six) hours as needed for muscle spasms. 60 tablet 0  . cetirizine (ZYRTEC) 10 MG tablet Take 1 tablet (10 mg total) by mouth daily. One tab daily for allergies (Patient not taking: Reported on 02/26/2015) 30 tablet 1  . diclofenac (VOLTAREN) 75 MG EC tablet Take 1 tablet (75 mg total) by mouth 2 (two) times daily. (Patient not taking: Reported on 02/26/2015) 60 tablet 0  . fluticasone (FLONASE) 50 MCG/ACT nasal spray Place 1 spray into both nostrils 2 (two) times daily. (Patient not taking: Reported on 02/26/2015) 1 g 2  . predniSONE (STERAPRED UNI-PAK) 10 MG tablet DISP ONE 6 DAYS TAPER TAKE AS DIRECTED WITH FOOD (Patient not taking: Reported on 02/26/2015) 21 tablet 0   No current facility-administered medications on file prior to visit.   No family history on file. History   Social History  . Marital Status: Single    Spouse Name: N/A  . Number of Children: N/A  . Years of Education: N/A   Occupational History  . Not on file.   Social History Main Topics  . Smoking status: Never Smoker   . Smokeless tobacco: Not on file  . Alcohol Use: Yes  . Drug Use: No  . Sexual Activity: Not on file   Other Topics Concern   . Not on file   Social History Narrative    Review of Systems: Constitutional: Negative for fever, chills, appetite change, weight loss,  fatigue. HENT: Negative for ear pain, ear discharge.nose bleeds, some allergy symptoms Eyes: Negative for pain, discharge, redness, itching and visual disturbance. Neck: Negative for pain, stiffness Respiratory: Negative for cough, shortness of breath,   Cardiovascular: Negative for chest pain, palpitations and leg swelling. Gastrointestinal: Negative for abdominal distention, abdominal pain, nausea, vomiting, diarrhea, constipations Genitourinary: Negative for dysuria, urgency, frequency, hematuria, flank pain,  Musculoskeletal: Positive for back pain with radiation into the legs, greater on the left. joint pain, joint  swelling, arthralgia and gait problem.Negative for weakness. Neurological: Negative for dizziness, tremors, seizures, syncope,   light-headedness, numbness and headaches.  Hematological: Negative for easy bruising or bleeding Psychiatric/Behavioral: Negative for depression, anxiety, decreased concentration, confusion   Objective:   Filed Vitals:   02/26/15 1103  BP: 108/65  Pulse: 53  Temp: 97.8 F (36.6 C)  Resp: 16    Physical Exam: Constitutional: Patient appears well-developed and well-nourished. No distress. HENT: Normocephalic, atraumatic, External right and left ear normal. Oropharynx is clear and moist.  Eyes: Conjunctivae and EOM are normal. PERRLA, no scleral icterus. Neck: Normal ROM. Neck supple. No lymphadenopathy, No thyromegaly. CVS: RRR, S1/S2 +, no murmurs, no gallops, no  rubs Pulmonary: Effort and breath sounds normal, no stridor, rhonchi, wheezes, rales.  Abdominal: Soft. Normoactive BS,, no distension, tenderness, rebound or guarding.  Musculoskeletal: Normal range of motion. No edema. Minor tenderness over the lower back. There is normal strength bilaterally of the lower extremeties. Straight leg raises  positive at 15 degrees. Neuro: Alert.Normal muscle tone coordination. Non-focal Skin: Skin is warm and dry. No rash noted. Not diaphoretic. No erythema. No pallor. Psychiatric: Normal mood and affect. Behavior, judgment, thought content normal.  Lab Results  Component Value Date   WBC 8.7 05/20/2010   HGB 17.0 05/20/2010   HCT 50.0 05/20/2010   MCV 89.7 05/20/2010   PLT 174 05/20/2010   Lab Results  Component Value Date   CREATININE 1.0 05/20/2010   BUN 14 05/20/2010   NA 141 05/20/2010   K 4.2 05/20/2010   CL 102 05/20/2010    No results found for: HGBA1C Lipid Panel  No results found for: CHOL, TRIG, HDL, CHOLHDL, VLDL, LDLCALC     Assessment and plan:   Health care maintenance Cmet with GFR, lipid panel, CBC Discusses and recommended healthy lifestyle including diet and exercise. Needs a tetanus booster. Will give at next visit in one month.  Low back pain with sciatica - Voltaren 75 mg. Bid with food. #60 with 2 refills. -Continue Robaxin for now -Referral to orthopedist    The patient was given clear instructions to go to ER or return to medical center if symptoms don't improve, worsen or new problems develop. The patient verbalized understanding. The patient was told to call to get lab results if they haven't heard anything in the next week.        Henrietta HooverLinda C. Bernhardt, MSN, FNP-BC  02/26/2015, 11:13 AM

## 2015-02-26 NOTE — Patient Instructions (Addendum)
Try to follow a healty diet which is low in fats and cholesterol and sugars. Focus on vegetable, friuits and lean non-fried meats. Beans are a good source of protein also. Try to do some walking daily Activity as allowed by back pain. Will attempt a referral to orthodepist.

## 2015-02-27 LAB — COMPLETE METABOLIC PANEL WITH GFR
ALBUMIN: 4 g/dL (ref 3.5–5.2)
ALT: 28 U/L (ref 0–53)
AST: 28 U/L (ref 0–37)
Alkaline Phosphatase: 72 U/L (ref 39–117)
BUN: 16 mg/dL (ref 6–23)
CALCIUM: 8.7 mg/dL (ref 8.4–10.5)
CHLORIDE: 104 meq/L (ref 96–112)
CO2: 27 meq/L (ref 19–32)
Creat: 0.81 mg/dL (ref 0.50–1.35)
GFR, Est African American: >89 mL/min
GLUCOSE: 91 mg/dL (ref 70–99)
POTASSIUM: 4.1 meq/L (ref 3.5–5.3)
Sodium: 138 mEq/L (ref 135–145)
TOTAL PROTEIN: 7.2 g/dL (ref 6.0–8.3)
Total Bilirubin: 0.7 mg/dL (ref 0.2–1.2)

## 2015-02-27 LAB — LIPID PANEL
CHOLESTEROL: 177 mg/dL (ref 0–200)
HDL: 32 mg/dL — ABNORMAL LOW (ref >=40)
LDL Cholesterol: 109 mg/dL — ABNORMAL HIGH (ref 0–99)
TRIGLYCERIDES: 179 mg/dL — AB (ref <150)
Total CHOL/HDL Ratio: 5.5 Ratio
VLDL: 36 mg/dL (ref 0–40)

## 2015-03-19 ENCOUNTER — Ambulatory Visit: Payer: No Typology Code available for payment source | Admitting: Family Medicine

## 2015-03-26 ENCOUNTER — Ambulatory Visit: Payer: No Typology Code available for payment source | Admitting: Family Medicine

## 2016-08-25 ENCOUNTER — Encounter (HOSPITAL_COMMUNITY): Payer: Self-pay | Admitting: Emergency Medicine

## 2016-08-25 ENCOUNTER — Ambulatory Visit (HOSPITAL_COMMUNITY)
Admission: EM | Admit: 2016-08-25 | Discharge: 2016-08-25 | Disposition: A | Discharge status: Home / Self Care | Payer: No Typology Code available for payment source | Attending: Family Medicine | Admitting: Family Medicine

## 2016-08-25 DIAGNOSIS — J019 Acute sinusitis, unspecified: Secondary | ICD-10-CM

## 2016-08-25 DIAGNOSIS — B9689 Other specified bacterial agents as the cause of diseases classified elsewhere: Secondary | ICD-10-CM

## 2016-08-25 MED ORDER — DOXYCYCLINE HYCLATE 100 MG PO CAPS: 100.0000 mg | ORAL_CAPSULE | Freq: Two times a day (BID) | ORAL | 0 refills | Status: AC

## 2016-08-25 MED ORDER — METHYLPREDNISOLONE 4 MG PO TABS: 4.0000 mg | ORAL_TABLET | Freq: Every day | ORAL | 0 refills | Status: AC

## 2016-08-25 NOTE — ED Provider Notes (Signed)
CSN: [id number]     Arrival date & time 08/25/16  1103 History   First MD Initiated Contact with Patient 08/25/16 1218     Chief Complaint  Patient presents with  . Cough   (Consider location/radiation/quality/duration/timing/severity/associated sxs/prior Treatment) 40 year old male presents with 2-3 week history of cough, sinus tenderness, and pressure. Symptoms started out as cold like but have lingered. Patient does not smoke, has not traveled recently, and is not have other environmental exposures. Nasal discharge has been yellow/green.   The history is provided by the patient.  Cough  Cough characteristics:  Non-productive, dry and hacking Sputum characteristics:  Yellow and green Severity:  Mild Duration:  3 weeks Timing:  Intermittent Progression:  Waxing and waning Chronicity:  New Smoker: no   Relieved by:  None tried Ineffective treatments:  None tried Associated symptoms: rhinorrhea and sinus congestion   Associated symptoms: no shortness of breath and no wheezing     Past Medical History:  Diagnosis Date  . Back pain   . Seasonal allergies    History reviewed. No pertinent surgical history. No family history on file. Social History  Substance Use Topics  . Smoking status: Never Smoker  . Smokeless tobacco: Not on file  . Alcohol use Yes    Review of Systems  Constitutional: Negative.   HENT: Positive for congestion, rhinorrhea, sinus pain and sinus pressure.   Eyes: Negative.   Respiratory: Positive for cough. Negative for chest tightness, shortness of breath and wheezing.   Gastrointestinal: Negative.   Musculoskeletal: Negative.     Allergies  Penicillins  Home Medications   Prior to Admission medications   Medication Sig Start Date End Date Taking? Authorizing Provider  cetirizine (ZYRTEC) 10 MG tablet Take 1 tablet (10 mg total) by mouth daily. One tab daily for allergies 02/26/15   Henrietta HooverLinda C Bernhardt, NP  diclofenac (VOLTAREN) 75 MG EC tablet  Take 1 tablet (75 mg total) by mouth 2 (two) times daily. 02/26/15   Henrietta HooverLinda C Bernhardt, NP  doxycycline (VIBRAMYCIN) 100 MG capsule Take 1 capsule (100 mg total) by mouth 2 (two) times daily. 08/25/16   Dorena BodoLawrence Carnelia Oscar, NP  fluticasone (FLONASE) 50 MCG/ACT nasal spray Place 1 spray into both nostrils 2 (two) times daily. 02/26/15   Henrietta HooverLinda C Bernhardt, NP  methocarbamol (ROBAXIN) 500 MG tablet Take 1-2 tablets (500-1,000 mg total) by mouth every 6 (six) hours as needed for muscle spasms. 06/07/14   Ozella Rocksavid J Merrell, MD  methylPREDNISolone (MEDROL) 4 MG tablet Take 1 tablet (4 mg total) by mouth daily. Take 6 tablets today, decrease by 1 tablet each day till finished. 08/25/16 08/31/16  Dorena BodoLawrence Elain Wixon, NP  predniSONE (STERAPRED UNI-PAK) 10 MG tablet DISP ONE 6 DAYS TAPER TAKE AS DIRECTED WITH FOOD Patient not taking: Reported on 02/26/2015 12/23/11   Randa SpikeKimberly G Lykins, DO   Meds Ordered and Administered this Visit  Medications - No data to display  BP 129/84   Pulse 66   Temp 98.7 F (37.1 C) (Oral)   Resp 16   Ht 5\' 4"  (1.626 m)   Wt 196 lb (88.9 kg)   SpO2 97%   BMI 33.64 kg/m  No data found.   Physical Exam  Constitutional: He is oriented to person, place, and time. He appears well-developed and well-nourished. No distress.  HENT:  Head: Normocephalic.  Right Ear: External ear normal.  Left Ear: External ear normal.  Nose: Right sinus exhibits maxillary sinus tenderness and frontal sinus tenderness. Left  sinus exhibits maxillary sinus tenderness and frontal sinus tenderness.  Mouth/Throat: Oropharynx is clear and moist. No oropharyngeal exudate.  Eyes: Pupils are equal, round, and reactive to light.  Neck: Normal range of motion. Neck supple. No JVD present.  Cardiovascular: Normal rate, regular rhythm and normal heart sounds.   Pulmonary/Chest: Effort normal and breath sounds normal. No respiratory distress.  Abdominal: Soft.  Lymphadenopathy:    He has no cervical adenopathy.   Neurological: He is alert and oriented to person, place, and time.  Skin: Skin is warm and dry. Capillary refill takes less than 2 seconds. He is not diaphoretic.    Urgent Care Course   Clinical Course     Procedures (including critical care time)  Labs Review Labs Reviewed - No data to display  Imaging Review No results found.   Visual Acuity Review  Right Eye Distance:   Left Eye Distance:   Bilateral Distance:    Right Eye Near:   Left Eye Near:    Bilateral Near:         MDM   1. Acute bacterial sinusitis   Acute sinusitis: RX Doxycycline and medrol dose pack. Recommend rest, fluids, mucinex, may take antihistamines should symptoms fail to improve or worsen follow up with PCP or return to clinic.      Dorena BodoLawrence Douglass Dunshee, NP 08/25/16 [phone]

## 2016-08-25 NOTE — ED Triage Notes (Signed)
PT reports cough, fevers at night, and lower back pain for 3 weeks.

## 2016-08-25 NOTE — Discharge Instructions (Signed)
You have been diagnosed with a sinus infection. Take your antibiotics as prescribed.You have also been given a steroid dose pack. Take 6 tablets today and each day take one less tablet till finished over 6 days (6,5,4,3,2,1). You may take tylenol and mucenix DM over the counter for symptoms. Should your symptoms fail to improve or worsen follow up with your primary care provider or return to clinic.
